# Patient Record
Sex: Male | Born: 1955 | Race: Black or African American | Hispanic: No | Marital: Single | Smoking: Current some day smoker
Health system: Southern US, Community
[De-identification: ages and names within clinical notes are randomized; demographics above are authoritative.]

## PROBLEM LIST (undated history)

## (undated) DIAGNOSIS — I10 Essential (primary) hypertension: Secondary | ICD-10-CM

## (undated) DIAGNOSIS — J45909 Unspecified asthma, uncomplicated: Secondary | ICD-10-CM

## (undated) HISTORY — PX: CARDIAC SURGERY: SHX584

---

## 2019-01-10 ENCOUNTER — Encounter (HOSPITAL_COMMUNITY): Payer: Self-pay | Admitting: Emergency Medicine

## 2019-01-10 ENCOUNTER — Emergency Department (HOSPITAL_COMMUNITY)
Admission: EM | Admit: 2019-01-10 | Discharge: 2019-01-11 | Disposition: A | Discharge status: Home / Self Care | Payer: No Typology Code available for payment source | Attending: Emergency Medicine | Admitting: Emergency Medicine

## 2019-01-10 ENCOUNTER — Other Ambulatory Visit: Payer: Self-pay

## 2019-01-10 ENCOUNTER — Emergency Department (HOSPITAL_COMMUNITY): Payer: No Typology Code available for payment source

## 2019-01-10 DIAGNOSIS — Y929 Unspecified place or not applicable: Secondary | ICD-10-CM | POA: Diagnosis not present

## 2019-01-10 DIAGNOSIS — M25561 Pain in right knee: Secondary | ICD-10-CM | POA: Insufficient documentation

## 2019-01-10 DIAGNOSIS — F17200 Nicotine dependence, unspecified, uncomplicated: Secondary | ICD-10-CM | POA: Insufficient documentation

## 2019-01-10 DIAGNOSIS — Y999 Unspecified external cause status: Secondary | ICD-10-CM | POA: Insufficient documentation

## 2019-01-10 DIAGNOSIS — I10 Essential (primary) hypertension: Secondary | ICD-10-CM | POA: Diagnosis not present

## 2019-01-10 DIAGNOSIS — Y939 Activity, unspecified: Secondary | ICD-10-CM | POA: Insufficient documentation

## 2019-01-10 DIAGNOSIS — R42 Dizziness and giddiness: Secondary | ICD-10-CM | POA: Diagnosis present

## 2019-01-10 DIAGNOSIS — S161XXA Strain of muscle, fascia and tendon at neck level, initial encounter: Secondary | ICD-10-CM | POA: Diagnosis not present

## 2019-01-10 HISTORY — DX: Essential (primary) hypertension: I10

## 2019-01-10 MED ORDER — IBUPROFEN 400 MG PO TABS
400.0000 mg | ORAL_TABLET | Freq: Once | ORAL | Status: AC
  Administered 2019-01-10: 400 mg via ORAL

## 2019-01-10 MED ORDER — IBUPROFEN 800 MG PO TABS
ORAL_TABLET | ORAL | Status: AC
  Filled 2019-01-10: qty 1
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
## 2019-01-11 ENCOUNTER — Emergency Department (HOSPITAL_COMMUNITY): Payer: No Typology Code available for payment source

## 2019-01-11 MED ORDER — CYCLOBENZAPRINE HCL 10 MG PO TABS: 10.0000 mg | ORAL_TABLET | Freq: Three times a day (TID) | ORAL | 0 refills | Status: DC | PRN

## 2019-01-11 NOTE — Discharge Instructions (Addendum)
Apply ice for 20-30 minutes at a time, 3-4 times a day.  Take acetaminophen and/or ibuprofen as needed for pain.  If you take the 2 medications together, they will give you better pain relief than either one by itself.
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
## 2021-09-20 ENCOUNTER — Inpatient Hospital Stay (HOSPITAL_COMMUNITY)
Admission: EM | Admit: 2021-09-20 | Discharge: 2021-09-22 | DRG: 871 | Disposition: A | Discharge status: Home / Self Care | Payer: Self-pay | Attending: Family Medicine | Admitting: Family Medicine

## 2021-09-20 ENCOUNTER — Emergency Department (HOSPITAL_COMMUNITY): Payer: Self-pay

## 2021-09-20 ENCOUNTER — Encounter (HOSPITAL_COMMUNITY): Payer: Self-pay

## 2021-09-20 ENCOUNTER — Other Ambulatory Visit: Payer: Self-pay

## 2021-09-20 DIAGNOSIS — N183 Chronic kidney disease, stage 3 unspecified: Secondary | ICD-10-CM | POA: Diagnosis present

## 2021-09-20 DIAGNOSIS — Z720 Tobacco use: Secondary | ICD-10-CM

## 2021-09-20 DIAGNOSIS — N1831 Chronic kidney disease, stage 3a: Secondary | ICD-10-CM

## 2021-09-20 DIAGNOSIS — Z716 Tobacco abuse counseling: Secondary | ICD-10-CM

## 2021-09-20 DIAGNOSIS — I509 Heart failure, unspecified: Secondary | ICD-10-CM

## 2021-09-20 DIAGNOSIS — I5043 Acute on chronic combined systolic (congestive) and diastolic (congestive) heart failure: Secondary | ICD-10-CM | POA: Diagnosis present

## 2021-09-20 DIAGNOSIS — Z28310 Unvaccinated for covid-19: Secondary | ICD-10-CM

## 2021-09-20 DIAGNOSIS — J9601 Acute respiratory failure with hypoxia: Secondary | ICD-10-CM

## 2021-09-20 DIAGNOSIS — J9600 Acute respiratory failure, unspecified whether with hypoxia or hypercapnia: Secondary | ICD-10-CM

## 2021-09-20 DIAGNOSIS — Z20822 Contact with and (suspected) exposure to covid-19: Secondary | ICD-10-CM | POA: Diagnosis present

## 2021-09-20 DIAGNOSIS — E1165 Type 2 diabetes mellitus with hyperglycemia: Secondary | ICD-10-CM | POA: Diagnosis present

## 2021-09-20 DIAGNOSIS — J440 Chronic obstructive pulmonary disease with acute lower respiratory infection: Secondary | ICD-10-CM | POA: Diagnosis present

## 2021-09-20 DIAGNOSIS — Z23 Encounter for immunization: Secondary | ICD-10-CM

## 2021-09-20 DIAGNOSIS — E1122 Type 2 diabetes mellitus with diabetic chronic kidney disease: Secondary | ICD-10-CM | POA: Diagnosis present

## 2021-09-20 DIAGNOSIS — E119 Type 2 diabetes mellitus without complications: Secondary | ICD-10-CM

## 2021-09-20 DIAGNOSIS — I5020 Unspecified systolic (congestive) heart failure: Secondary | ICD-10-CM | POA: Diagnosis present

## 2021-09-20 DIAGNOSIS — N179 Acute kidney failure, unspecified: Secondary | ICD-10-CM | POA: Diagnosis present

## 2021-09-20 DIAGNOSIS — J441 Chronic obstructive pulmonary disease with (acute) exacerbation: Secondary | ICD-10-CM

## 2021-09-20 DIAGNOSIS — I130 Hypertensive heart and chronic kidney disease with heart failure and stage 1 through stage 4 chronic kidney disease, or unspecified chronic kidney disease: Secondary | ICD-10-CM | POA: Diagnosis present

## 2021-09-20 DIAGNOSIS — R0600 Dyspnea, unspecified: Secondary | ICD-10-CM

## 2021-09-20 DIAGNOSIS — F17210 Nicotine dependence, cigarettes, uncomplicated: Secondary | ICD-10-CM | POA: Diagnosis present

## 2021-09-20 DIAGNOSIS — A419 Sepsis, unspecified organism: Principal | ICD-10-CM | POA: Diagnosis present

## 2021-09-20 DIAGNOSIS — I5189 Other ill-defined heart diseases: Secondary | ICD-10-CM

## 2021-09-20 DIAGNOSIS — K219 Gastro-esophageal reflux disease without esophagitis: Secondary | ICD-10-CM | POA: Diagnosis present

## 2021-09-20 DIAGNOSIS — R739 Hyperglycemia, unspecified: Secondary | ICD-10-CM

## 2021-09-20 DIAGNOSIS — Z79899 Other long term (current) drug therapy: Secondary | ICD-10-CM

## 2021-09-20 DIAGNOSIS — J189 Pneumonia, unspecified organism: Secondary | ICD-10-CM | POA: Diagnosis present

## 2021-09-20 HISTORY — DX: Unspecified asthma, uncomplicated: J45.909

## 2021-09-20 LAB — MRSA NEXT GEN BY PCR, NASAL: MRSA by PCR Next Gen: NOT DETECTED

## 2021-09-20 LAB — TROPONIN I (HIGH SENSITIVITY)
Troponin I (High Sensitivity): 75 ng/L — ABNORMAL HIGH (ref <18)
Troponin I (High Sensitivity): 64 ng/L — ABNORMAL HIGH (ref <18)

## 2021-09-20 LAB — CBC WITH DIFFERENTIAL/PLATELET
Abs Immature Granulocytes: 0.05 10*3/uL (ref 0.00–0.07)
Basophils Absolute: 0.1 10*3/uL (ref 0.0–0.1)
Basophils Relative: 1 %
Eosinophils Absolute: 0.6 10*3/uL — ABNORMAL HIGH (ref 0.0–0.5)
Eosinophils Relative: 7 %
HCT: 52.4 % — ABNORMAL HIGH (ref 39.0–52.0)
Hemoglobin: 16.9 g/dL (ref 13.0–17.0)
Immature Granulocytes: 1 %
Lymphocytes Relative: 19 %
Lymphs Abs: 1.5 10*3/uL (ref 0.7–4.0)
MCH: 31 pg (ref 26.0–34.0)
MCHC: 32.3 g/dL (ref 30.0–36.0)
MCV: 96.1 fL (ref 80.0–100.0)
Monocytes Absolute: 1.1 10*3/uL — ABNORMAL HIGH (ref 0.1–1.0)
Monocytes Relative: 13 %
Neutro Abs: 4.8 10*3/uL (ref 1.7–7.7)
Neutrophils Relative %: 59 %
Platelets: 124 10*3/uL — ABNORMAL LOW (ref 150–400)
RBC: 5.45 MIL/uL (ref 4.22–5.81)
RDW: 13.5 % (ref 11.5–15.5)
WBC: 8.2 10*3/uL (ref 4.0–10.5)
nRBC: 0 % (ref 0.0–0.2)

## 2021-09-20 LAB — COMPREHENSIVE METABOLIC PANEL
ALT: 64 U/L — ABNORMAL HIGH (ref 0–44)
AST: 37 U/L (ref 15–41)
Albumin: 4.7 g/dL (ref 3.5–5.0)
Alkaline Phosphatase: 51 U/L (ref 38–126)
Anion gap: 12 (ref 5–15)
BUN: 13 mg/dL (ref 8–23)
CO2: 21 mmol/L — ABNORMAL LOW (ref 22–32)
Calcium: 9.4 mg/dL (ref 8.9–10.3)
Chloride: 106 mmol/L (ref 98–111)
Creatinine, Ser: 1.75 mg/dL — ABNORMAL HIGH (ref 0.61–1.24)
GFR, Estimated: 43 mL/min — ABNORMAL LOW (ref >60)
Glucose, Bld: 226 mg/dL — ABNORMAL HIGH (ref 70–99)
Potassium: 4.3 mmol/L (ref 3.5–5.1)
Sodium: 139 mmol/L (ref 135–145)
Total Bilirubin: 1.4 mg/dL — ABNORMAL HIGH (ref 0.3–1.2)
Total Protein: 8.2 g/dL — ABNORMAL HIGH (ref 6.5–8.1)

## 2021-09-20 LAB — HEMOGLOBIN A1C
Hgb A1c MFr Bld: 6.4 % — ABNORMAL HIGH (ref 4.8–5.6)
Mean Plasma Glucose: 136.98 mg/dL

## 2021-09-20 LAB — RESP PANEL BY RT-PCR (FLU A&B, COVID) ARPGX2
Influenza A by PCR: NEGATIVE
Influenza B by PCR: NEGATIVE
SARS Coronavirus 2 by RT PCR: NEGATIVE

## 2021-09-20 LAB — BLOOD GAS, ARTERIAL
Acid-base deficit: 3.5 mmol/L — ABNORMAL HIGH (ref 0.0–2.0)
Bicarbonate: 21.6 mmol/L (ref 20.0–28.0)
FIO2: 40
O2 Saturation: 98.4 %
Patient temperature: 39.5
pCO2 arterial: 44.1 mmHg (ref 32.0–48.0)
pH, Arterial: 7.318 — ABNORMAL LOW (ref 7.350–7.450)
pO2, Arterial: 176 mmHg — ABNORMAL HIGH (ref 83.0–108.0)

## 2021-09-20 LAB — TSH: TSH: 0.539 u[IU]/mL (ref 0.350–4.500)

## 2021-09-20 LAB — BRAIN NATRIURETIC PEPTIDE: B Natriuretic Peptide: 50 pg/mL (ref 0.0–100.0)

## 2021-09-20 LAB — GLUCOSE, CAPILLARY: Glucose-Capillary: 141 mg/dL — ABNORMAL HIGH (ref 70–99)

## 2021-09-20 LAB — LACTIC ACID, PLASMA: Lactic Acid, Venous: 0.5 mmol/L (ref 0.5–1.9)

## 2021-09-20 LAB — PHOSPHORUS: Phosphorus: 2.9 mg/dL (ref 2.5–4.6)

## 2021-09-20 LAB — MAGNESIUM: Magnesium: 2.3 mg/dL (ref 1.7–2.4)

## 2021-09-20 LAB — CBG MONITORING, ED: Glucose-Capillary: 187 mg/dL — ABNORMAL HIGH (ref 70–99)

## 2021-09-20 MED ORDER — SODIUM CHLORIDE 0.9 % IV SOLN
1.0000 g | INTRAVENOUS | Status: DC
  Administered 2021-09-21 – 2021-09-22 (×2): 1 g via INTRAVENOUS
  Filled 2021-09-20 (×3): qty 10

## 2021-09-20 MED ORDER — ACETAMINOPHEN 650 MG RE SUPP
650.0000 mg | Freq: Once | RECTAL | Status: AC
  Administered 2021-09-20: 650 mg via RECTAL
  Filled 2021-09-20: qty 1

## 2021-09-20 MED ORDER — ALBUTEROL (5 MG/ML) CONTINUOUS INHALATION SOLN
10.0000 mg/h | INHALATION_SOLUTION | Freq: Once | RESPIRATORY_TRACT | Status: DC
Start: 2021-09-20 — End: 2021-09-22
  Filled 2021-09-20: qty 20

## 2021-09-20 MED ORDER — ACETAMINOPHEN 325 MG PO TABS
650.0000 mg | ORAL_TABLET | Freq: Four times a day (QID) | ORAL | Status: DC | PRN
  Administered 2021-09-20: 650 mg via ORAL
  Filled 2021-09-20: qty 2

## 2021-09-20 MED ORDER — ALBUTEROL SULFATE (2.5 MG/3ML) 0.083% IN NEBU: 2.5000 mg | INHALATION_SOLUTION | RESPIRATORY_TRACT | Status: DC | PRN

## 2021-09-20 MED ORDER — SODIUM CHLORIDE 0.9 % IV SOLN
500.0000 mg | Freq: Once | INTRAVENOUS | Status: AC
  Administered 2021-09-20: 500 mg via INTRAVENOUS
  Filled 2021-09-20: qty 500

## 2021-09-20 MED ORDER — HYDRALAZINE HCL 20 MG/ML IJ SOLN: 10.0000 mg | Freq: Three times a day (TID) | INTRAMUSCULAR | Status: DC | PRN

## 2021-09-20 MED ORDER — INSULIN ASPART 100 UNIT/ML IJ SOLN
0.0000 [IU] | Freq: Three times a day (TID) | INTRAMUSCULAR | Status: DC
  Administered 2021-09-21 (×3): 2 [IU] via SUBCUTANEOUS
  Administered 2021-09-22: 3 [IU] via SUBCUTANEOUS

## 2021-09-20 MED ORDER — ENOXAPARIN SODIUM 40 MG/0.4ML IJ SOSY
40.0000 mg | PREFILLED_SYRINGE | INTRAMUSCULAR | Status: DC
  Administered 2021-09-20 – 2021-09-21 (×2): 40 mg via SUBCUTANEOUS
  Filled 2021-09-20 (×2): qty 0.4

## 2021-09-20 MED ORDER — SODIUM CHLORIDE 0.9 % IV SOLN
1.0000 g | Freq: Once | INTRAVENOUS | Status: AC
  Administered 2021-09-20: 1 g via INTRAVENOUS
  Filled 2021-09-20: qty 10

## 2021-09-20 MED ORDER — METHYLPREDNISOLONE SODIUM SUCC 40 MG IJ SOLR
40.0000 mg | Freq: Two times a day (BID) | INTRAMUSCULAR | Status: DC
  Administered 2021-09-20 – 2021-09-22 (×4): 40 mg via INTRAVENOUS
  Filled 2021-09-20 (×4): qty 1

## 2021-09-20 MED ORDER — ALBUTEROL SULFATE (2.5 MG/3ML) 0.083% IN NEBU
INHALATION_SOLUTION | RESPIRATORY_TRACT | Status: AC
  Administered 2021-09-20: 10 mg
  Filled 2021-09-20: qty 12

## 2021-09-20 MED ORDER — PANTOPRAZOLE SODIUM 40 MG PO TBEC
40.0000 mg | DELAYED_RELEASE_TABLET | Freq: Every day | ORAL | Status: DC
  Administered 2021-09-20 – 2021-09-22 (×3): 40 mg via ORAL
  Filled 2021-09-20 (×3): qty 1

## 2021-09-20 MED ORDER — ONDANSETRON HCL 4 MG/2ML IJ SOLN: 4.0000 mg | Freq: Four times a day (QID) | INTRAMUSCULAR | Status: DC | PRN

## 2021-09-20 MED ORDER — FUROSEMIDE 10 MG/ML IJ SOLN
40.0000 mg | Freq: Every day | INTRAMUSCULAR | Status: DC
  Administered 2021-09-20 – 2021-09-21 (×2): 40 mg via INTRAVENOUS
  Filled 2021-09-20 (×2): qty 4

## 2021-09-20 MED ORDER — BUDESONIDE 0.5 MG/2ML IN SUSP
0.5000 mg | Freq: Two times a day (BID) | RESPIRATORY_TRACT | Status: DC
  Administered 2021-09-20 – 2021-09-22 (×4): 0.5 mg via RESPIRATORY_TRACT
  Filled 2021-09-20 (×4): qty 2

## 2021-09-20 MED ORDER — INSULIN ASPART 100 UNIT/ML IJ SOLN: 0.0000 [IU] | Freq: Every day | INTRAMUSCULAR | Status: DC

## 2021-09-20 MED ORDER — INSULIN DETEMIR 100 UNIT/ML ~~LOC~~ SOLN
7.0000 [IU] | Freq: Every day | SUBCUTANEOUS | Status: DC
  Administered 2021-09-20 – 2021-09-21 (×2): 7 [IU] via SUBCUTANEOUS
  Filled 2021-09-20 (×3): qty 0.07

## 2021-09-20 MED ORDER — ONDANSETRON HCL 4 MG PO TABS: 4.0000 mg | ORAL_TABLET | Freq: Four times a day (QID) | ORAL | Status: DC | PRN

## 2021-09-20 MED ORDER — DEXTROSE 5 % IV SOLN
250.0000 mg | INTRAVENOUS | Status: DC
  Filled 2021-09-20: qty 250

## 2021-09-20 MED ORDER — NICOTINE 21 MG/24HR TD PT24
21.0000 mg | MEDICATED_PATCH | Freq: Every day | TRANSDERMAL | Status: DC
  Administered 2021-09-20 – 2021-09-22 (×3): 21 mg via TRANSDERMAL
  Filled 2021-09-20 (×3): qty 1

## 2021-09-20 MED ORDER — IPRATROPIUM-ALBUTEROL 0.5-2.5 (3) MG/3ML IN SOLN
3.0000 mL | Freq: Four times a day (QID) | RESPIRATORY_TRACT | Status: DC
  Administered 2021-09-20 – 2021-09-22 (×6): 3 mL via RESPIRATORY_TRACT
  Filled 2021-09-20 (×6): qty 3

## 2021-09-20 MED ORDER — INFLUENZA VAC SPLIT QUAD 0.5 ML IM SUSY
0.5000 mL | PREFILLED_SYRINGE | INTRAMUSCULAR | Status: AC
  Administered 2021-09-22: 0.5 mL via INTRAMUSCULAR
  Filled 2021-09-20: qty 0.5

## 2021-09-20 MED ORDER — DM-GUAIFENESIN ER 30-600 MG PO TB12
1.0000 | ORAL_TABLET | Freq: Two times a day (BID) | ORAL | Status: DC
  Administered 2021-09-20 – 2021-09-22 (×4): 1 via ORAL
  Filled 2021-09-20 (×4): qty 1

## 2021-09-20 MED ORDER — PNEUMOCOCCAL VAC POLYVALENT 25 MCG/0.5ML IJ INJ
0.5000 mL | INJECTION | INTRAMUSCULAR | Status: AC
  Administered 2021-09-22: 0.5 mL via INTRAMUSCULAR
  Filled 2021-09-20: qty 0.5

## 2021-09-20 NOTE — ED Triage Notes (Signed)
Noncompliant with meds at home. Has not taken any or been to PCP in years

## 2021-09-20 NOTE — ED Notes (Signed)
Lab at bedside. Will cycle BP once cultures are obtained.

## 2021-09-20 NOTE — ED Notes (Signed)
Emergency Contact: Margaretha Seeds [phone]

## 2021-09-20 NOTE — ED Triage Notes (Signed)
EMS from home. Cc of SOB.  Nebs albuterol x2  ci pap 125 solumedrol  20r ac Diaphoretic. Tripod.  85% initial. 96% on ci pap.

## 2021-09-20 NOTE — H&P (Signed)
History and Physical    Aaron Donaldson CWC:[id number] DOB: 04-12-1956 DOA: 09/20/2021  PCP: Patient, No Pcp Per (Inactive)   Patient coming from: Home  I have personally briefly reviewed patient's old medical records in [place]  Chief Complaint: Shortness of breath, dyspnea on exertion and coughing spells.  HPI: Aaron Donaldson is a 65 y.o. male with medical history significant of hypertension, chronic kidney disease a stage IIIa, asthma/COPD and tobacco abuse; who presented to the emergency department secondary to worsening shortness of breath, dyspnea exertion productive cough.  Patient reports symptoms have been present for the last 2 days or so and worsening.  Yesterday night he has to call EMS secondary to worsening respiratory status he was assessed nebulized improved and initially not transported for further evaluation.  Couple hours later symptoms worsen more and EMS was recalled; at that time his steroids IV were provided and due to significant hypoxia appreciated patient was started on CPAP and transported to the emergency department for further evaluation and management.  Patient reports associated productive coughing spells and general malaise. Patient reports no nausea, no vomiting, no hemoptysis, no dysuria, no hematuria, no focal weaknesses, no sick contact, overt bleeding or any other complaints.  Not vaccinated against COVID.  COVID PCR in the ED negative.  ED Course: Positive tachypnea and high temperature; chest x-ray demonstrating left lower lobe infiltrate suggestive of pneumonia and also vascular congestion.  Mild elevation in his troponin without acute ischemic changes on EKG.  Normal WBCs, normal BPN, elevated blood sugar and a creatinine of 1.75.  Patient placed on BiPAP, steroids and nebulizer management provided.  Empiric antibiotics for pneumonia initiated and TRH call to place in the hospital for further evaluation and management.  Review of Systems: As  per HPI otherwise all other systems reviewed and are negative.   Past Medical History:  Diagnosis Date   Asthma    Hypertension     Past Surgical History:  Procedure Laterality Date   CARDIAC SURGERY      Social History  reports that he has been smoking. He has never used smokeless tobacco. He reports that he does not drink alcohol and does not use drugs.  No Known Allergies  Family history:  -History reviewed and outside high blood pressure No pertinent family history.   Prior to Admission medications   Medication Sig Start Date End Date Taking? Authorizing Provider  cyclobenzaprine (FLEXERIL) 10 MG tablet Take 1 tablet (10 mg total) by mouth 3 (three) times daily as needed for muscle spasms. Patient not taking: Reported on 76/11/6071 05[birth year]   Delora Fuel, MD    Physical Exam: Vitals:   09/20/21 1300 09/20/21 1500 09/20/21 1600 09/20/21 1700  BP: (!) 160/91     Pulse: 70 63 (!) 56 70  Resp: (!) 23 (!) 21 (!) 21 (!) 27  Temp: 99 F (37.2 C) 98 F (36.7 C)    TempSrc: Oral Oral    SpO2: 98% 98% 98% 100%  Weight: 81.9 kg     Height: _0  (1.88 m)       Constitutional: Mild distress secondary to shortness of breath and inability to speak in full sentences.  During initial evaluation patient was requiring to be on BiPAP.  No chest pain, no nausea, no vomiting. Vitals:   09/20/21 1300 09/20/21 1500 09/20/21 1600 09/20/21 1700  BP: (!) 160/91     Pulse: 70 63 (!) 56 70  Resp: (!) 23 (!) 21 (!) 21 (!)  27  Temp: 99 F (37.2 C) 98 F (36.7 C)    TempSrc: Oral Oral    SpO2: 98% 98% 98% 100%  Weight: 81.9 kg     Height: _0  (1.88 m)      Eyes: PERRL, lids and conjunctivae normal, no icterus, no nystagmus. ENMT: Mucous membranes are moist. Posterior pharynx clear of any exudate or lesions. Neck: normal, supple, no masses, no thyromegaly, no JVD on exam. Respiratory: Positive rhonchi bilaterally, decreased breath sounds at the bases, expiratory wheezing  appreciated.  Positive tachypnea seen on examination. Cardiovascular: Regular rate and rhythm, no murmurs / rubs / gallops. No extremity edema.  Abdomen: no tenderness, no masses palpated. No hepatosplenomegaly. Bowel sounds positive.  Musculoskeletal: no clubbing / cyanosis. No joint deformity upper and lower extremities. Good ROM, no contractures. Normal muscle tone.  Skin: no rashes, lesions, ulcers. No induration Neurologic: CN 2-12 grossly intact. Sensation intact, DTR normal. Strength 5/5 in all 4.  Psychiatric: Normal judgment and insight. Alert and oriented x 3. Normal mood.   Labs on Admission: I have personally reviewed following labs and imaging studies  CBC: Recent Labs  Lab 09/20/21 0703  WBC 8.2  NEUTROABS 4.8  HGB 16.9  HCT 52.4*  MCV 96.1  PLT 124*    Basic Metabolic Panel: Recent Labs  Lab 09/20/21 0703  NA 139  K 4.3  CL 106  CO2 21*  GLUCOSE 226*  BUN 13  CREATININE 1.75*  CALCIUM 9.4    GFR: Estimated Creatinine Clearance: 49.4 mL/min (A) (by C-G formula based on SCr of 1.75 mg/dL (H)).  Liver Function Tests: Recent Labs  Lab 09/20/21 0703  AST 37  ALT 64*  ALKPHOS 51  BILITOT 1.4*  PROT 8.2*  ALBUMIN 4.7    Radiological Exams on Admission: DG Chest Port 1 View  Result Date: 09/20/2021 CLINICAL DATA:  Shortness of breath EXAM: PORTABLE CHEST 1 VIEW COMPARISON:  None. FINDINGS: Heart is enlarged. Mediastinum appears stable. Calcified plaques in the aortic arch. Pulmonary vascular congestion and prominent interstitial lung markings. Likely small left pleural effusion with associated atelectasis/infiltrate. No pneumothorax. IMPRESSION: 1. Cardiomegaly and pulmonary vascular congestion. 2. Likely small left pleural effusion with associated atelectasis/scarring/infiltrate. 3. COPD. Electronically Signed   By: Ofilia Neas M.D.   On: 09/20/2021 07:35    EKG: Independently reviewed.  Sinus rhythm, no acute ischemic  changes.  Assessment/Plan 1-acute respiratory failure with hypoxia -Per EMS report patient hypoxic in the mid 80s with excessive tachypnea and requirement of CPAP prior to transfer to ED. -Saturation appreciated to be in the mid 90s while on BiPAP. -Patient respiratory failure appears to be a combination of COPD exacerbation along with pneumonia; given findings of pleural effusion and vascular congestion he could potentially also have an adjuvant presentation of CHF. (Patient reported no prior history of heart failure). -We will check 2D echo, low-dose Lasix will be provided and will follow daily weights and strict I's and O's. -Patient started on antibiotics for community-acquired pneumonia -Influenza and COVID PCR negative -Incentive spirometer, flutter valve, nebulizer management (using DuoNeb and Pulmicort) along with IV steroids will be provided. -Wean off BiPAP and oxygen supplementation as tolerated.  2-sepsis -Patient met sepsis criteria on presentation with elevated temperature, elevated respiratory rate and findings of For left lower lobe pneumonia. -As mentioned above antibiotics will be provided -Normal lactic acid -Normal WBCs currently. -Will provide as needed antipyretics -Provide oxygen supplementation and follow clinical response.  3-hyperglycemia -No prior history of diabetes -We  will check A1c -Sliding scale insulin and Levemir will be ordered.  4-chronic kidney disease a stage IIIa -Minimize nephrotoxic agents -Maintain adequate hydration -Avoid hypotension and contrast -Follow renal function trend.  5-Tobacco abuse -Cessation counseling provided -Nicotine patch has been ordered.  6-GI prophylaxis -PPI has been ordered.  7-hypertension -No prior antihypertensive agents has been seen on his medication records -Blood pressure fairly stable at this time -Will use as needed hydralazine.    DVT prophylaxis: Lovenox Code Status:   Full code. Family  Communication:  No family at bedside. Disposition Plan:   Patient is from:  Home  Anticipated DC to:  Home  Anticipated DC date:  To be determined; very likely 11/3--11/4  Anticipated DC barriers: A stabilization respiratory status/COPD exacerbation and resolution of sepsis.  Completion work-up for potential component of heart failure.  Consults called:  None  Admission status:  Inpatient, stepdown, length of stay more than 2 midnights.  Severity of Illness: The appropriate patient status for this patient is INPATIENT. Inpatient status is judged to be reasonable and necessary in order to provide the required intensity of service to ensure the patient's safety. The patient's presenting symptoms, physical exam findings, and initial radiographic and laboratory data in the context of their chronic comorbidities is felt to place them at high risk for further clinical deterioration. Furthermore, it is not anticipated that the patient will be medically stable for discharge from the hospital within 2 midnights of admission.   * I certify that at the point of admission it is my clinical judgment that the patient will require inpatient hospital care spanning beyond 2 midnights from the point of admission due to high intensity of service, high risk for further deterioration and high frequency of surveillance required.Barton Dubois MD [facility]  How to contact the [facility] Attending or Consulting provider [place] or covering provider during after hours [place], for this patient?   Check the care team in [facility] and look for a) attending/consulting [facility] provider listed and b) the [facility] team listed Log into [email or web address] and use [place]'s universal password to access. If you do not have the password, please contact the hospital operator. Locate the [facility] provider you are looking for under [facility] and page to a number that you can be directly reached. If you still have difficulty reaching the  provider, please page the [facility] (Director on Call) for the Hospitalists listed on amion for assistance.  09/20/2021, 5:38 PM

## 2021-09-20 NOTE — ED Provider Notes (Addendum)
[facility] EMERGENCY DEPARTMENT Provider Note   CSN: [id number] Arrival date & time: 09/20/21  [id number]     History Chief Complaint  Patient presents with   Shortness of Breath    Aaron Donaldson is a 65 y.o. male.  Patient brought in by [facility] in respiratory distress.  Patient has a history of COPD.  Not seen in our institution in the past.  Unknown to patient he was febrile temp of 103.  He came in on CPAP.  Was received Solu-Medrol.  EMS was out to his house shortly after midnight for shortness of breath he got a nebulizer treatment and felt better.  So he was not brought in at that time.  He recalled EMS in significant distress diaphoretic severely tachypneic.  Not clear at this time whether patient is normally on any oxygen.      Past Medical History:  Diagnosis Date   Asthma    Hypertension     There are no problems to display for this patient.   Past Surgical History:  Procedure Laterality Date   CARDIAC SURGERY         No family history on file.  Social History   Tobacco Use   Smoking status: Some Days   Smokeless tobacco: Never  Substance Use Topics   Alcohol use: Never   Drug use: Never    Home Medications Prior to Admission medications   Medication Sig Start Date End Date Taking? Authorizing Provider  cyclobenzaprine (FLEXERIL) 10 MG tablet Take 1 tablet (10 mg total) by mouth 3 (three) times daily as needed for muscle spasms. [id number]   Delora Fuel, MD    Allergies    Patient has no known allergies.  Review of Systems   Review of Systems  Constitutional:  Positive for fever. Negative for chills.  HENT:  Negative for ear pain and sore throat.   Eyes:  Negative for pain and visual disturbance.  Respiratory:  Positive for shortness of breath. Negative for cough.   Cardiovascular:  Negative for chest pain, palpitations and leg swelling.  Gastrointestinal:  Negative for abdominal pain and vomiting.  Genitourinary:  Negative for  dysuria and hematuria.  Musculoskeletal:  Negative for arthralgias and back pain.  Skin:  Negative for color change and rash.  Neurological:  Negative for seizures and syncope.  All other systems reviewed and are negative.  Physical Exam Updated Vital Signs BP (!) 177/122   Pulse 81   Temp (!) 103.1 F (39.5 C) (Rectal)   Resp (!) 24   Ht 1.88 m (6\' 2" )   Wt 82 kg   SpO2 100%   BMI 23.21 kg/m   Physical Exam Vitals and nursing note reviewed.  Constitutional:      General: He is in acute distress.     Appearance: He is well-developed. He is diaphoretic.  HENT:     Head: Normocephalic and atraumatic.  Eyes:     Extraocular Movements: Extraocular movements intact.     Conjunctiva/sclera: Conjunctivae normal.     Pupils: Pupils are equal, round, and reactive to light.  Cardiovascular:     Rate and Rhythm: Regular rhythm. Tachycardia present.     Heart sounds: No murmur heard. Pulmonary:     Effort: Respiratory distress present.     Breath sounds: Wheezing present.  Abdominal:     Palpations: Abdomen is soft.     Tenderness: There is no abdominal tenderness.  Musculoskeletal:        General:  No swelling.     Cervical back: Normal range of motion and neck supple.     Right lower leg: No edema.     Left lower leg: No edema.  Skin:    General: Skin is warm.     Capillary Refill: Capillary refill takes less than 2 seconds.  Neurological:     General: No focal deficit present.     Mental Status: He is alert and oriented to person, place, and time.    ED Results / Procedures / Treatments   Labs (all labs ordered are listed, but only abnormal results are displayed) Labs Reviewed  CBG MONITORING, ED - Abnormal; Notable for the following components:      Result Value   Glucose-Capillary 187 (*)    All other components within normal limits  RESP PANEL BY RT-PCR (FLU A&B, COVID) ARPGX2  CULTURE, BLOOD (ROUTINE X 2)  CULTURE, BLOOD (ROUTINE X 2)  LACTIC ACID, PLASMA   CBC WITH DIFFERENTIAL/PLATELET  COMPREHENSIVE METABOLIC PANEL  BRAIN NATRIURETIC PEPTIDE  BLOOD GAS, ARTERIAL  TROPONIN I (HIGH SENSITIVITY)    EKG EKG Interpretation  Date/Time:  Tuesday September 20 2021 07:04:22 EDT Ventricular Rate:  120 PR Interval:  169 QRS Duration: 114 QT Interval:  294 QTC Calculation: 416 R Axis:   209 Text Interpretation: Sinus tachycardia Multiple ventricular premature complexes LAE, consider biatrial enlargement IRBBB and LPFB Abnormal R-wave progression, late transition Lateral infarct, age indeterminate Artifact in lead(s) I II III aVR aVL aVF V1 V3 V4 V6 No previous ECGs available Interpretation limited secondary to artifact Confirmed by Fredia Sorrow [id number]) on 09/20/2021 7:35:41 AM  Radiology DG Chest Port 1 View  Result Date: 09/20/2021 CLINICAL DATA:  Shortness of breath EXAM: PORTABLE CHEST 1 VIEW COMPARISON:  None. FINDINGS: Heart is enlarged. Mediastinum appears stable. Calcified plaques in the aortic arch. Pulmonary vascular congestion and prominent interstitial lung markings. Likely small left pleural effusion with associated atelectasis/infiltrate. No pneumothorax. IMPRESSION: 1. Cardiomegaly and pulmonary vascular congestion. 2. Likely small left pleural effusion with associated atelectasis/scarring/infiltrate. 3. COPD. Electronically Signed   By: Ofilia Neas M.D.   On: 09/20/2021 07:35    Procedures Procedures   Medications Ordered in ED Medications  albuterol (PROVENTIL,VENTOLIN) solution continuous neb (10 mg/hr Nebulization Not Given 09/20/21 [id number])  acetaminophen (TYLENOL) suppository 650 mg (has no administration in time range)  cefTRIAXone (ROCEPHIN) 1 g in sodium chloride 0.9 % 100 mL IVPB (has no administration in time range)  azithromycin (ZITHROMAX) 500 mg in sodium chloride 0.9 % 250 mL IVPB (has no administration in time range)  albuterol (PROVENTIL) (2.5 MG/3ML) 0.083% nebulizer solution (10 mg  Given 09/20/21 [id number])     ED Course  I have reviewed the triage vital signs and the nursing notes.  Pertinent labs & imaging results that were available during my care of the patient were reviewed by me and considered in my medical decision making (see chart for details).    MDM Rules/Calculators/A&P                         CRITICAL CARE Performed by: Fredia Sorrow Total critical care time: 45 minutes Critical care time was exclusive of separately billable procedures and treating other patients. Critical care was necessary to treat or prevent imminent or life-threatening deterioration. Critical care was time spent personally by me on the following activities: development of treatment plan with patient and/or surrogate as well as nursing, discussions with consultants, evaluation of patient's  response to treatment, examination of patient, obtaining history from patient or surrogate, ordering and performing treatments and interventions, ordering and review of laboratory studies, ordering and review of radiographic studies, pulse oximetry and re-evaluation of patient's condition.   Patient brought in by [facility] EMS in obvious respiratory distress.  Was on CPAP.  Switched to BiPAP here.  Patient received Solu-Medrol by EMS.  And that we did a continuous nebulizer here.  Patient now settling down.  Patient was diaphoretic significantly tachypneic using accessory muscles to breathe.  Now improving be able to talk better.  Not sweaty.  Still very hypertensive.  Chest x-ray consistent with COPD also possible pneumonia.  Patient does have a temp of 103.  This could be due to a viral pneumonia influenza COVID testing pending.  The patient started on pneumonia antibiotics.  Lactic acid ordered blood cultures done.  Patient did meet sepsis criteria and that he was tachycardic and febrile and tachypneic but not hypotensive at all.  Held off on giving large amount of fluids while were sorting out whether there may be some  evidence of some pulmonary edema which is possible on the chest x-ray.  We will hold off on fluid boluses at this time.  Patient given Tylenol for the fever.  Patient clearly will need admission.  As we gather more information.  Patient's blood pressure shows signs of improvement now that he settled down.  We thought maybe the hypertension was related to the distress.  Systolic now 127.  We will follow this carefully.  Chest x-ray with questionable infiltrate.  Patient started on antibiotics for possible pneumonia.  Zithromax and Rocephin.  Patient's fever may very well be viral COVID and influenza testing still pending.  Patient's initial blood gas showed reasonable PCO2 O2 on the BiPAP.  Patient's improved significantly on the BiPAP feeling much more comfortable.  Initial troponin was elevated at 70 5 repeat was 64.  So that without significant increase.   Patient's symptoms are mostly exacerbation of COPD.  But does have the fever.  Do not feel that patient needs fluid challenge for sepsis lactic acid was not elevated no leukocytosis.  Final Clinical Impression(s) / ED Diagnoses Final diagnoses:  COPD exacerbation (HCC)  Acute respiratory failure, unspecified whether with hypoxia or hypercapnia [facility])    Rx / DC Orders ED Discharge Orders     None        Vanetta Mulders, MD 09/20/21 Theodis Blaze    Vanetta Mulders, MD 09/20/21 1000    Vanetta Mulders, MD 09/20/21 1001

## 2021-09-20 NOTE — Progress Notes (Signed)
**Note De-Identified Willet Schleifer Obfuscation** Patient removed from BIPAP and placed on 2.5L [place]; tolerating well at this time, VS WNL. RRT to continue to monitor.

## 2021-09-21 ENCOUNTER — Inpatient Hospital Stay (HOSPITAL_COMMUNITY): Payer: Self-pay

## 2021-09-21 DIAGNOSIS — R0609 Other forms of dyspnea: Secondary | ICD-10-CM

## 2021-09-21 DIAGNOSIS — A419 Sepsis, unspecified organism: Principal | ICD-10-CM

## 2021-09-21 DIAGNOSIS — E119 Type 2 diabetes mellitus without complications: Secondary | ICD-10-CM

## 2021-09-21 DIAGNOSIS — N183 Chronic kidney disease, stage 3 unspecified: Secondary | ICD-10-CM | POA: Diagnosis present

## 2021-09-21 LAB — ECHOCARDIOGRAM COMPLETE
AR max vel: 2.47 cm2
AV Area VTI: 2.92 cm2
AV Area mean vel: 2.6 cm2
AV Mean grad: 3 mmHg
AV Peak grad: 6.7 mmHg
Ao pk vel: 1.29 m/s
Area-P 1/2: 4.93 cm2
Calc EF: 28.4 %
Height: 74 in
MV VTI: 2.89 cm2
S' Lateral: 5.7 cm
Single Plane A2C EF: 33.3 %
Single Plane A4C EF: 25.3 %
Weight: 2881.85 [oz_av]

## 2021-09-21 LAB — GLUCOSE, CAPILLARY
Glucose-Capillary: 136 mg/dL — ABNORMAL HIGH (ref 70–99)
Glucose-Capillary: 129 mg/dL — ABNORMAL HIGH (ref 70–99)
Glucose-Capillary: 123 mg/dL — ABNORMAL HIGH (ref 70–99)
Glucose-Capillary: 137 mg/dL — ABNORMAL HIGH (ref 70–99)

## 2021-09-21 LAB — CBC
HCT: 46.2 % (ref 39.0–52.0)
Hemoglobin: 15 g/dL (ref 13.0–17.0)
MCH: 30.2 pg (ref 26.0–34.0)
MCHC: 32.5 g/dL (ref 30.0–36.0)
MCV: 93.1 fL (ref 80.0–100.0)
Platelets: 204 K/uL (ref 150–400)
RBC: 4.96 MIL/uL (ref 4.22–5.81)
RDW: 13.5 % (ref 11.5–15.5)
WBC: 9.5 K/uL (ref 4.0–10.5)
nRBC: 0 % (ref 0.0–0.2)

## 2021-09-21 LAB — BASIC METABOLIC PANEL
Anion gap: 8 (ref 5–15)
BUN: 21 mg/dL (ref 8–23)
CO2: 26 mmol/L (ref 22–32)
Calcium: 9.1 mg/dL (ref 8.9–10.3)
Chloride: 105 mmol/L (ref 98–111)
Creatinine, Ser: 1.46 mg/dL — ABNORMAL HIGH (ref 0.61–1.24)
GFR, Estimated: 53 mL/min — ABNORMAL LOW (ref >60)
Glucose, Bld: 138 mg/dL — ABNORMAL HIGH (ref 70–99)
Potassium: 4.4 mmol/L (ref 3.5–5.1)
Sodium: 139 mmol/L (ref 135–145)

## 2021-09-21 LAB — HIV ANTIBODY (ROUTINE TESTING W REFLEX): HIV Screen 4th Generation wRfx: NONREACTIVE

## 2021-09-21 MED ORDER — CHLORHEXIDINE GLUCONATE CLOTH 2 % EX PADS
6.0000 | MEDICATED_PAD | Freq: Every day | CUTANEOUS | Status: DC
  Administered 2021-09-21 – 2021-09-22 (×2): 6 via TOPICAL

## 2021-09-21 MED ORDER — SODIUM CHLORIDE 0.9 % IV SOLN
500.0000 mg | INTRAVENOUS | Status: DC
  Administered 2021-09-21: 500 mg via INTRAVENOUS
  Filled 2021-09-21 (×2): qty 500

## 2021-09-21 MED ORDER — FUROSEMIDE 40 MG PO TABS
40.0000 mg | ORAL_TABLET | Freq: Every day | ORAL | Status: DC
  Administered 2021-09-22: 40 mg via ORAL
  Filled 2021-09-21: qty 1

## 2021-09-21 NOTE — Progress Notes (Signed)
*  PRELIMINARY RESULTS* Echocardiogram 2D Echocardiogram has been performed.  Carolyne Fiscal 09/21/2021, 2:55 PM

## 2021-09-21 NOTE — Progress Notes (Signed)
PROGRESS NOTE     Aaron Donaldson, is a 65 y.o. male, DOB - [birth year], [id number]  Admit date - 09/20/2021   Admitting Physician Vassie Loll, MD  Outpatient Primary MD for the patient is Patient, No Pcp Per (Inactive)  LOS - 1  Chief Complaint  Patient presents with   Shortness of Breath        Brief Narrative:  65 y.o. male with medical history significant of hypertension, chronic kidney disease  stage IIIa, asthma/COPD and tobacco abuse admitted with acute hypoxic respiratory failure on 09/20/2021 in the setting of pneumonia  Assessment & Plan:   Principal Problem:   Sepsis due to Lt Sided CAP Active Problems:   Acute respiratory failure with hypoxia (HCC)   CKD (chronic kidney disease), stage IIIA   Diabetes mellitus type 2, controlled (HCC)   1)Sepsis secondary to left-sided community-acquired pneumonia-- -COVID-negative -Sepsis pathophysiology improving -Continue IV Rocephin and azithromycin, continue bronchodilators and mucolytics  2)Acute COPD exacerbation/tobacco abuse --- secondary to #1 above -Management as above 1, IV Solu-Medrol as ordered, nicotine patch as ordered  3) acute hypoxic respiratory failure----to #1 #2 above, currently requiring 2 L of oxygen via nasal cannula to keep O2 sats above 90% -On room air today O2 sats 86% -Patient was not on O2 PTA  4)AKI----acute kidney injury on CKD stage -3A   -Creatinine is down to 1.4 from 1.75  renally adjust medications, avoid nephrotoxic agents / dehydration  / hypotension  5)DM2-A1c 6.4 reflecting fair diabetic control PTA -Use Novolog/Humalog Sliding scale insulin with Accu-Cheks/Fingersticks as ordered   6)Elevated BP---  may use IV Hydralazine 10 mg  Every 4 hours Prn for systolic blood pressure over 160 mmhg  7)GERD--continue Protonix especially while on steroid   Disposition/Need for in-Hospital Stay- patient unable to be discharged at this time due to -sepsis secondary to pneumonia  requiring IV antibiotics, IV fluids and supplemental oxygen -Possible discharge in a couple of days if hypoxia continues to improve  Status is: Inpatient  Remains inpatient appropriate because: See disposition above  Disposition: The patient is from: Home              Anticipated d/c is to: Home              Anticipated d/c date is: 1 day              Patient currently is not medically stable to d/c. Barriers: Not Clinically Stable-   Code Status :  -  Code Status: Full Code   Family Communication:    NA (patient is alert, awake and coherent)   Consults  :  na  DVT Prophylaxis  :   - SCDs   enoxaparin (LOVENOX) injection 40 mg Start: 09/20/21 1830    Lab Results  Component Value Date   PLT 204 09/21/2021    Inpatient Medications  Scheduled Meds:  albuterol  10 mg/hr Nebulization Once   budesonide (PULMICORT) nebulizer solution  0.5 mg Nebulization BID   Chlorhexidine Gluconate Cloth  6 each Topical Daily   dextromethorphan-guaiFENesin  1 tablet Oral BID   enoxaparin (LOVENOX) injection  40 mg Subcutaneous Q24H   [START ON 09/22/2021] furosemide  40 mg Oral Daily   influenza vac split quadrivalent PF  0.5 mL Intramuscular Tomorrow-1000   insulin aspart  0-15 Units Subcutaneous TID WC   insulin aspart  0-5 Units Subcutaneous QHS   insulin detemir  7 Units Subcutaneous QHS   ipratropium-albuterol  3 mL Nebulization QID  methylPREDNISolone (SOLU-MEDROL) injection  40 mg Intravenous Q12H   nicotine  21 mg Transdermal Daily   pantoprazole  40 mg Oral Daily   pneumococcal 23 valent vaccine  0.5 mL Intramuscular Tomorrow-1000   Continuous Infusions:  azithromycin 500 mg (09/21/21 1020)   cefTRIAXone (ROCEPHIN)  IV 1 g (09/21/21 0920)   PRN Meds:.acetaminophen, albuterol, hydrALAZINE, ondansetron **OR** ondansetron (ZOFRAN) IV   Anti-infectives (From admission, onward)    Start     Dose/Rate Route Frequency Ordered Stop   09/21/21 1000  cefTRIAXone (ROCEPHIN) 1 g in  sodium chloride 0.9 % 100 mL IVPB        1 g 200 mL/hr over 30 Minutes Intravenous Every 24 hours 09/20/21 1746     09/21/21 1000  azithromycin (ZITHROMAX) 250 mg in dextrose 5 % 125 mL IVPB  Status:  Discontinued        250 mg 125 mL/hr over 60 Minutes Intravenous Every 24 hours 09/20/21 1746 09/21/21 0734   09/21/21 1000  azithromycin (ZITHROMAX) 500 mg in sodium chloride 0.9 % 250 mL IVPB        500 mg 250 mL/hr over 60 Minutes Intravenous Every 24 hours 09/21/21 0734 09/25/21 0959   09/20/21 0815  cefTRIAXone (ROCEPHIN) 1 g in sodium chloride 0.9 % 100 mL IVPB        1 g 200 mL/hr over 30 Minutes Intravenous  Once 09/20/21 0805 09/20/21 0956   09/20/21 0815  azithromycin (ZITHROMAX) 500 mg in sodium chloride 0.9 % 250 mL IVPB        500 mg 250 mL/hr over 60 Minutes Intravenous  Once 09/20/21 0805 09/20/21 1106         Subjective: Mariana Kaufman today has no fevers, no emesis,  No chest pain,   -Cough, shortness of breath and hypoxia persist -Desaturates easily   Objective: Vitals:   09/21/21 1119 09/21/21 1205 09/21/21 1305 09/21/21 1526  BP:   (!) 145/95   Pulse:   72   Resp:      Temp: 98.6 F (37 C)  98.4 F (36.9 C)   TempSrc: Oral  Oral   SpO2:  97% 99% 99%  Weight:      Height:        Intake/Output Summary (Last 24 hours) at 09/21/2021 1533 Last data filed at 09/21/2021 1122 Gross per 24 hour  Intake 360 ml  Output 2750 ml  Net -2390 ml   Filed Weights   09/20/21 0703 09/20/21 1300 09/21/21 0415  Weight: 82 kg 81.9 kg 81.7 kg   Physical Exam  Gen:- Awake Alert,  in no apparent distress  HEENT:- [place].AT, No sclera icterus Nose- [place] 2L/min Neck-Supple Neck,No JVD,.  Lungs-missed breath sounds especially on the left, scattered wheezes and rhonchi CV- S1, S2 normal, regular  Abd-  +ve B.Sounds, Abd Soft, No tenderness,    Extremity/Skin:- No  edema, pedal pulses present  Psych-affect is appropriate, oriented x3 Neuro-no new focal deficits, no  tremors  Data Reviewed: I have personally reviewed following labs and imaging studies  CBC: Recent Labs  Lab 09/20/21 0703 09/21/21 0351  WBC 8.2 9.5  NEUTROABS 4.8  --   HGB 16.9 15.0  HCT 52.4* 46.2  MCV 96.1 93.1  PLT 124* 0000000   Basic Metabolic Panel: Recent Labs  Lab 09/20/21 0703 09/20/21 1756 09/21/21 0351  NA 139  --  139  K 4.3  --  4.4  CL 106  --  105  CO2 21*  --  26  GLUCOSE 226*  --  138*  BUN 13  --  21  CREATININE 1.75*  --  1.46*  CALCIUM 9.4  --  9.1  MG  --  2.3  --   PHOS  --  2.9  --    GFR: Estimated Creatinine Clearance: 59.1 mL/min (A) (by C-G formula based on SCr of 1.46 mg/dL (H)). Liver Function Tests: Recent Labs  Lab 09/20/21 0703  AST 37  ALT 64*  ALKPHOS 51  BILITOT 1.4*  PROT 8.2*  ALBUMIN 4.7   No results for input(s): LIPASE, AMYLASE in the last 168 hours. No results for input(s): AMMONIA in the last 168 hours. Coagulation Profile: No results for input(s): INR, PROTIME in the last 168 hours. Cardiac Enzymes: No results for input(s): CKTOTAL, CKMB, CKMBINDEX, TROPONINI in the last 168 hours. BNP (last 3 results) No results for input(s): PROBNP in the last 8760 hours. HbA1C: Recent Labs    09/20/21 1756  HGBA1C 6.4*   CBG: Recent Labs  Lab 09/20/21 0705 09/20/21 2130 09/21/21 0721 09/21/21 1121  GLUCAP 187* 141* 136* 129*   Lipid Profile: No results for input(s): CHOL, HDL, LDLCALC, TRIG, CHOLHDL, LDLDIRECT in the last 72 hours. Thyroid Function Tests: Recent Labs    09/20/21 1756  TSH 0.539   Anemia Panel: No results for input(s): VITAMINB12, FOLATE, FERRITIN, TIBC, IRON, RETICCTPCT in the last 72 hours. Urine analysis: No results found for: COLORURINE, APPEARANCEUR, LABSPEC, [place], GLUCOSEU, HGBUR, BILIRUBINUR, [place], [place], UROBILINOGEN, NITRITE, LEUKOCYTESUR Sepsis Labs: @LABRCNTIP (procalcitonin:4,lacticidven:4)  ) Recent Results (from the past 240 hour(s))  Culture, blood (Routine X 2)  w Reflex to ID Panel     Status: None (Preliminary result)   Collection Time: 09/20/21  8:00 AM   Specimen: BLOOD LEFT HAND  Result Value Ref Range Status   Specimen Description BLOOD LEFT HAND  Final   Special Requests   Final    BOTTLES DRAWN AEROBIC AND ANAEROBIC Blood Culture results may not be optimal due to an inadequate volume of blood received in culture bottles   Culture   Final    NO GROWTH 1 DAY Performed at [facility], [address], [place], [place] [address]    Report Status PENDING  Incomplete  Culture, blood (Routine X 2) w Reflex to ID Panel     Status: None (Preliminary result)   Collection Time: 09/20/21  8:08 AM   Specimen: Right Antecubital; Blood  Result Value Ref Range Status   Specimen Description RIGHT ANTECUBITAL  Final   Special Requests   Final    BOTTLES DRAWN AEROBIC AND ANAEROBIC Blood Culture results may not be optimal due to an inadequate volume of blood received in culture bottles   Culture   Final    NO GROWTH 1 DAY Performed at [facility], [address], [place], [place] [address]    Report Status PENDING  Incomplete  Resp Panel by RT-PCR (Flu A&B, Covid) Nasopharyngeal Swab     Status: None   Collection Time: 09/20/21  9:59 AM   Specimen: Nasopharyngeal Swab; Nasopharyngeal(NP) swabs in vial transport medium  Result Value Ref Range Status   SARS Coronavirus 2 by RT PCR NEGATIVE NEGATIVE Final    Comment: (NOTE) SARS-CoV-2 target nucleic acids are NOT DETECTED.  The SARS-CoV-2 RNA is generally detectable in upper respiratory specimens during the acute phase of infection. The lowest concentration of SARS-CoV-2 viral copies this assay can detect is 138 copies/mL. A negative result does not preclude SARS-Cov-2 infection  and should not be used as the sole basis for treatment or other patient management decisions. A negative result may occur with  improper specimen collection/handling, submission of specimen other than nasopharyngeal swab,  presence of viral mutation(s) within the areas targeted by this assay, and inadequate number of viral copies(<138 copies/mL). A negative result must be combined with clinical observations, patient history, and epidemiological information. The expected result is Negative.  Fact Sheet for Patients:  EntrepreneurPulse.com.au  Fact Sheet for Healthcare Providers:  IncredibleEmployment.be  This test is no t yet approved or cleared by the [place] FDA and  has been authorized for detection and/or diagnosis of SARS-CoV-2 by FDA under an Emergency Use Authorization (EUA). This EUA will remain  in effect (meaning this test can be used) for the duration of the COVID-19 declaration under Section 564(b)(1) of the Act, 21 U.S.C.section 360bbb-3(b)(1), unless the authorization is terminated  or revoked sooner.       Influenza A by PCR NEGATIVE NEGATIVE Final   Influenza B by PCR NEGATIVE NEGATIVE Final    Comment: (NOTE) The Xpert Xpress SARS-CoV-2/FLU/RSV plus assay is intended as an aid in the diagnosis of influenza from Nasopharyngeal swab specimens and should not be used as a sole basis for treatment. Nasal washings and aspirates are unacceptable for Xpert Xpress SARS-CoV-2/FLU/RSV testing.  Fact Sheet for Patients: EntrepreneurPulse.com.au  Fact Sheet for Healthcare Providers: IncredibleEmployment.be  This test is not yet approved or cleared by the [place] FDA and has been authorized for detection and/or diagnosis of SARS-CoV-2 by FDA under an Emergency Use Authorization (EUA). This EUA will remain in effect (meaning this test can be used) for the duration of the COVID-19 declaration under Section 564(b)(1) of the Act, 21 U.S.C. section 360bbb-3(b)(1), unless the authorization is terminated or revoked.  Performed at [facility], [address], [place], [place] [address]   MRSA Next Gen by PCR,  Nasal     Status: None   Collection Time: 09/20/21  1:00 PM   Specimen: Nasal Mucosa; Nasal Swab  Result Value Ref Range Status   MRSA by PCR Next Gen NOT DETECTED NOT DETECTED Final    Comment: (NOTE) The GeneXpert MRSA Assay (FDA approved for NASAL specimens only), is one component of a comprehensive MRSA colonization surveillance program. It is not intended to diagnose MRSA infection nor to guide or monitor treatment for MRSA infections. Test performance is not FDA approved in patients less than 63 years old. Performed at [facility], [address] [place], [place] [address]       Radiology Studies: [facility] Chest [facility] 1 View  Result Date: 09/20/2021 CLINICAL DATA:  Shortness of breath EXAM: PORTABLE CHEST 1 VIEW COMPARISON:  None. FINDINGS: Heart is enlarged. Mediastinum appears stable. Calcified plaques in the aortic arch. Pulmonary vascular congestion and prominent interstitial lung markings. Likely small left pleural effusion with associated atelectasis/infiltrate. No pneumothorax. IMPRESSION: 1. Cardiomegaly and pulmonary vascular congestion. 2. Likely small left pleural effusion with associated atelectasis/scarring/infiltrate. 3. COPD. Electronically Signed   By: Ofilia Neas M.D.   On: 09/20/2021 07:35     Scheduled Meds:  albuterol  10 mg/hr Nebulization Once   budesonide (PULMICORT) nebulizer solution  0.5 mg Nebulization BID   Chlorhexidine Gluconate Cloth  6 each Topical Daily   dextromethorphan-guaiFENesin  1 tablet Oral BID   enoxaparin (LOVENOX) injection  40 mg Subcutaneous Q24H   [START ON 09/22/2021] furosemide  40 mg Oral Daily   influenza vac split quadrivalent PF  0.5  mL Intramuscular Tomorrow-1000   insulin aspart  0-15 Units Subcutaneous TID WC   insulin aspart  0-5 Units Subcutaneous QHS   insulin detemir  7 Units Subcutaneous QHS   ipratropium-albuterol  3 mL Nebulization QID   methylPREDNISolone (SOLU-MEDROL) injection  40 mg Intravenous Q12H   nicotine   21 mg Transdermal Daily   pantoprazole  40 mg Oral Daily   pneumococcal 23 valent vaccine  0.5 mL Intramuscular Tomorrow-1000   Continuous Infusions:  azithromycin 500 mg (09/21/21 1020)   cefTRIAXone (ROCEPHIN)  IV 1 g (09/21/21 0920)     LOS: 1 day    Roxan Hockey M.D on 09/21/2021 at 3:33 PM  Go to [email or web address] - for contact info  [facility] - Office  [phone]  If 7PM-7AM, please contact night-coverage [email or web address] Password [facility] 09/21/2021, 3:33 PM

## 2021-09-22 ENCOUNTER — Inpatient Hospital Stay (HOSPITAL_COMMUNITY): Payer: Self-pay

## 2021-09-22 DIAGNOSIS — Z720 Tobacco use: Secondary | ICD-10-CM | POA: Diagnosis present

## 2021-09-22 DIAGNOSIS — I5189 Other ill-defined heart diseases: Secondary | ICD-10-CM

## 2021-09-22 DIAGNOSIS — I5020 Unspecified systolic (congestive) heart failure: Secondary | ICD-10-CM

## 2021-09-22 DIAGNOSIS — I5043 Acute on chronic combined systolic (congestive) and diastolic (congestive) heart failure: Secondary | ICD-10-CM

## 2021-09-22 DIAGNOSIS — J441 Chronic obstructive pulmonary disease with (acute) exacerbation: Secondary | ICD-10-CM | POA: Diagnosis present

## 2021-09-22 LAB — GLUCOSE, CAPILLARY
Glucose-Capillary: 114 mg/dL — ABNORMAL HIGH (ref 70–99)
Glucose-Capillary: 200 mg/dL — ABNORMAL HIGH (ref 70–99)

## 2021-09-22 LAB — BASIC METABOLIC PANEL
Anion gap: 9 (ref 5–15)
BUN: 26 mg/dL — ABNORMAL HIGH (ref 8–23)
CO2: 29 mmol/L (ref 22–32)
Calcium: 9.5 mg/dL (ref 8.9–10.3)
Chloride: 102 mmol/L (ref 98–111)
Creatinine, Ser: 1.53 mg/dL — ABNORMAL HIGH (ref 0.61–1.24)
GFR, Estimated: 50 mL/min — ABNORMAL LOW (ref >60)
Glucose, Bld: 121 mg/dL — ABNORMAL HIGH (ref 70–99)
Potassium: 4.3 mmol/L (ref 3.5–5.1)
Sodium: 140 mmol/L (ref 135–145)

## 2021-09-22 MED ORDER — GUAIFENESIN ER 600 MG PO TB12
600.0000 mg | ORAL_TABLET | Freq: Two times a day (BID) | ORAL | 0 refills | Status: AC
Start: 2021-09-22 — End: 2021-10-02

## 2021-09-22 MED ORDER — FUROSEMIDE 20 MG PO TABS: 20.0000 mg | ORAL_TABLET | ORAL | 3 refills | Status: AC

## 2021-09-22 MED ORDER — NICOTINE 21 MG/24HR TD PT24: 21.0000 mg | MEDICATED_PATCH | Freq: Every day | TRANSDERMAL | 0 refills | Status: AC

## 2021-09-22 MED ORDER — SPIRONOLACTONE 25 MG PO TABS: 25.0000 mg | ORAL_TABLET | Freq: Every day | ORAL | 11 refills | Status: AC

## 2021-09-22 MED ORDER — AZITHROMYCIN 500 MG PO TABS: 500.0000 mg | ORAL_TABLET | Freq: Every day | ORAL | 0 refills | Status: AC

## 2021-09-22 MED ORDER — ALBUTEROL SULFATE (2.5 MG/3ML) 0.083% IN NEBU: 2.5000 mg | INHALATION_SOLUTION | RESPIRATORY_TRACT | 12 refills | Status: AC | PRN

## 2021-09-22 MED ORDER — VALSARTAN 40 MG PO TABS: 40.0000 mg | ORAL_TABLET | Freq: Every day | ORAL | 11 refills | Status: AC

## 2021-09-22 MED ORDER — BISOPROLOL-HYDROCHLOROTHIAZIDE 2.5-6.25 MG PO TABS: 1.0000 | ORAL_TABLET | Freq: Every day | ORAL | 11 refills | Status: AC

## 2021-09-22 MED ORDER — ALBUTEROL SULFATE HFA 108 (90 BASE) MCG/ACT IN AERS: 2.0000 | INHALATION_SPRAY | Freq: Four times a day (QID) | RESPIRATORY_TRACT | 2 refills | Status: AC | PRN

## 2021-09-22 MED ORDER — SPIRIVA HANDIHALER 18 MCG IN CAPS: 18.0000 ug | ORAL_CAPSULE | Freq: Every day | RESPIRATORY_TRACT | 2 refills | Status: AC

## 2021-09-22 MED ORDER — PREDNISONE 20 MG PO TABS: 40.0000 mg | ORAL_TABLET | Freq: Every day | ORAL | 0 refills | Status: AC

## 2021-09-22 MED ORDER — CEFDINIR 300 MG PO CAPS: 300.0000 mg | ORAL_CAPSULE | Freq: Two times a day (BID) | ORAL | 0 refills | Status: AC

## 2021-09-22 NOTE — Progress Notes (Signed)
SATURATION QUALIFICATIONS: (This note is used to comply with regulatory documentation for home oxygen)  Patient Saturations on Room Air at Rest = 92%  Patient Saturations on Room Air while Ambulating = 91%   Pt ambulated > 500 feet on room air, tolerated well. No increased SOB, able to carry on normal conversation while walking.  MD Courage notified of results.

## 2021-09-22 NOTE — Clinical Social Work Note (Addendum)
Consulted for heart failure home screen, patient does not have diagnosis of heart failure. Patient did indicated that he does not follow a HH diet nor does he take daily weights.  Patient in need of nebulizer. Discussed DME choices. Patient wants nebulizer to be delivered to his home.  Nebulizer ordered from [place] at [employer].   Aaron Donaldson, [name], LCSW

## 2021-09-22 NOTE — Discharge Summary (Signed)
Aaron Donaldson, is a 65 y.o. male  DOB 27-Nov-1955  MRN [id number].  Admission date:  09/20/2021  Admitting Physician  Barton Dubois, MD  Discharge Date:  09/22/2021   Primary MD  Patient, No Pcp Per (Inactive)  Recommendations for primary care physician for things to follow:   1)Very low-salt diet advised 2)Weigh yourself daily, call if you gain more than 3 pounds in 1 day or more than 5 pounds in 1 week as your diuretic medications (Lasix/Furosemide) may need to be adjusted 3)Limit your Fluid  intake to no more than 60 ounces (1.8 Liters) per day 4)Complete abstinence from Tobacco advised  5)Follow up with Cardiologist as outpatient for Heart Failure evaluation---- 6)Avoid ibuprofen/Advil/Aleve/Motrin/Goody Powders/Naproxen/BC powders/Meloxicam/Diclofenac/Indomethacin and other Nonsteroidal anti-inflammatory medications as these will make you more likely to bleed and can cause stomach ulcers, can also cause Kidney problems.  7)Repeat BMP/Kidney and Electrolyte Test in 7 to 10 days    Admission Diagnosis  COPD exacerbation (HCC) [J44.1] Acute respiratory failure with hypoxia (HCC) [J96.01] Acute respiratory failure, unspecified whether with hypoxia or hypercapnia ([place]) [J96.00]   Discharge Diagnosis  COPD exacerbation ([place]) [J44.1] Acute respiratory failure with hypoxia (HCC) [J96.01] Acute respiratory failure, unspecified whether with hypoxia or hypercapnia ([place]) [J96.00]    Principal Problem:   Sepsis due to Lt Sided CAP Active Problems:   Acute respiratory failure with hypoxia (HCC)   Acute on chronic combined systolic and diastolic CHF (congestive heart failure) /EF 20 to 25 % with Grade 2 Diastolic Dysfucntion   HFrEF (heart failure with reduced ejection fraction) - EF 20 to 25%   Grade II diastolic dysfunction/Combined Systolic and Diastolic CHF    CKD (chronic kidney disease), stage IIIA    Diabetes mellitus type 2, controlled ([place])   COPD with acute exacerbation /Tobacco Abuse   Tobacco abuse      Past Medical History:  Diagnosis Date   Asthma    Hypertension     Past Surgical History:  Procedure Laterality Date   CARDIAC SURGERY       HPI  from the history and physical done on the day of admission:     Chief Complaint: Shortness of breath, dyspnea on exertion and coughing spells.   HPI: Aaron Donaldson is a 65 y.o. male with medical history significant of hypertension, chronic kidney disease a stage IIIa, asthma/COPD and tobacco abuse; who presented to the emergency department secondary to worsening shortness of breath, dyspnea exertion productive cough.  Patient reports symptoms have been present for the last 2 days or so and worsening.  Yesterday night he has to call EMS secondary to worsening respiratory status he was assessed nebulized improved and initially not transported for further evaluation.  Couple hours later symptoms worsen more and EMS was recalled; at that time his steroids IV were provided and due to significant hypoxia appreciated patient was started on CPAP and transported to the emergency department for further evaluation and management.  Patient reports associated productive coughing spells and general malaise. Patient reports no  nausea, no vomiting, no hemoptysis, no dysuria, no hematuria, no focal weaknesses, no sick contact, overt bleeding or any other complaints.   Not vaccinated against COVID.  COVID PCR in the ED negative.   ED Course: Positive tachypnea and high temperature; chest x-ray demonstrating left lower lobe infiltrate suggestive of pneumonia and also vascular congestion.  Mild elevation in his troponin without acute ischemic changes on EKG.  Normal WBCs, normal BPN, elevated blood sugar and a creatinine of 1.75.  Patient placed on BiPAP, steroids and nebulizer management provided.  Empiric antibiotics for pneumonia initiated and TRH call  to place in the hospital for further evaluation and management.   Review of Systems: As per HPI otherwise all other systems reviewed and are negative.       Hospital Course:       Brief Narrative:  65 y.o. male with medical history significant of hypertension, chronic kidney disease  stage IIIa, asthma/COPD and tobacco abuse admitted with acute hypoxic respiratory failure on 09/20/2021 in the setting of pneumonia   Assessment & Plan:   Principal Problem: Patient Active Problem List   Diagnosis Date Noted   Acute on chronic combined systolic and diastolic CHF (congestive heart failure) /EF 20 to 25 % with Grade 2 Diastolic Dysfucntion 123456    Priority: High   HFrEF (heart failure with reduced ejection fraction) - EF 20 to 25% 09/22/2021    Priority: High   Grade II diastolic dysfunction/Combined Systolic and Diastolic CHF  123456    Priority: High   Sepsis due to Lt Sided CAP 09/21/2021    Priority: High   Acute respiratory failure with hypoxia ([place]) 09/20/2021    Priority: High   COPD with acute exacerbation /Tobacco Abuse 09/22/2021    Priority: Medium    Tobacco abuse 09/22/2021    Priority: Medium    CKD (chronic kidney disease), stage IIIA 09/21/2021    Priority: Medium    Diabetes mellitus type 2, controlled ([place]) 09/21/2021    Priority: Medium       1)Sepsis secondary to left-sided community-acquired pneumonia-- -COVID-negative -Sepsis pathophysiology resolved -Treated with IV Rocephin and azithromycin,bronchodilators and mucolytics -Okay to discharge on p.o. azithromycin and Omnicef, along with bronchodilators and mucolytics   2)Acute COPD exacerbation/tobacco abuse --- secondary to #1 above -Management as above 1,  -Treated with IV Solu-Medrol as ordered, nicotine patch as ordered -Discharge home on p.o. prednisone, Spiriva and albuterol nebulizer treatments.   3) acute hypoxic respiratory failure----to #1 #2 above,  -Initially O2 sats was 86% on  room air patient required 2 L of oxygen via nasal cannula to keep O2 sats above 90%, with treatment as above #1 at 2 hypoxia resolved  -Patient was not on O2 PTA -Hypoxia has resolved, O2 sats in low 90s on room air post ambulation   4)AKI----acute kidney injury on CKD stage -3A   -Creatinine is down to 1.5 from 1.75  renally adjust medications, avoid nephrotoxic agents / dehydration  / hypotension -Repeat BMP in about a week or so due to need for losartan, Aldactone and Lasix   5)HFrEF--patient will combined systolic and diastolic dysfunction CHF -Echo  from 09/21/2021 shows EF of 20 to 123456, grade 2 diastolic dysfunction and regional wall motion abnormalities -Start bisoprolol/HCTZ, losartan and Aldactone -May be a candidate for Entresto--will defer decision to cardiology team -May benefit from [facility] to rule out ischemic cardiomyopathy--we will defer decision to cardiology -May use Lasix as needed with weight gain/fluid overload -Outpatient follow-up with cardiology strongly  advised  6)DM2-A1c 6.4 reflecting fair diabetic control PTA --Diet controlled advised anticipate that sugars will improve once patient is off prednisone    Disposition--- Home   Disposition: The patient is from: Home              Anticipated d/c is to: Home             Code Status :  -  Code Status: Full Code    Family Communication:    NA (patient is alert, awake and coherent)    Consults  :  Na  Discharge Condition: stable  Follow UP----cardiology service  Follow-up Information     Erma Heritage, PA-C. Schedule an appointment as soon as possible for a visit in 1 week(s).   Specialties: Physician Assistant, Cardiology Contact information: [place] [phone]                 Diet and Activity recommendation:  As advised  Discharge Instructions    Discharge Instructions     ([place]) Call MD:  Anytime you have any of the following symptoms: 1)  3 pound weight gain in 24 hours or 5 pounds in 1 week 2) shortness of breath, with or without a dry hacking cough 3) swelling in the hands, feet or stomach 4) if you have to sleep on extra pillows at night in order to breathe.   Complete by: As directed    AMB referral to CHF clinic   Complete by: As directed    Call MD for:  difficulty breathing, headache or visual disturbances   Complete by: As directed    Call MD for:  persistant dizziness or light-headedness   Complete by: As directed    Call MD for:  persistant nausea and vomiting   Complete by: As directed    Call MD for:  severe uncontrolled pain   Complete by: As directed    Call MD for:  temperature >100.4   Complete by: As directed    Diet - low sodium heart healthy   Complete by: As directed    Discharge instructions   Complete by: As directed    1)Very low-salt diet advised 2)Weigh yourself daily, call if you gain more than 3 pounds in 1 day or more than 5 pounds in 1 week as your diuretic medications (Lasix/Furosemide) may need to be adjusted 3)Limit your Fluid  intake to no more than 60 ounces (1.8 Liters) per day 4)Complete abstinence from Tobacco advised  5)Follow up with Cardiologist as outpatient for Heart Failure evaluation---- 6)Avoid ibuprofen/Advil/Aleve/Motrin/Goody Powders/Naproxen/BC powders/Meloxicam/Diclofenac/Indomethacin and other Nonsteroidal anti-inflammatory medications as these will make you more likely to bleed and can cause stomach ulcers, can also cause Kidney problems.  7)Repeat BMP/Kidney and Electrolyte Test in 7 to 10 days   For home use only DME Nebulizer machine   Complete by: As directed    Patient needs a nebulizer to treat with the following condition: COPD with acute exacerbation ([place])   Length of Need: Lifetime   Increase activity slowly   Complete by: As directed          Discharge Medications     Allergies as of 09/22/2021   No Known Allergies      Medication List     STOP  taking these medications    cyclobenzaprine 10 MG tablet Commonly known as: FLEXERIL       TAKE these medications    albuterol (2.5 MG/3ML) 0.083%  nebulizer solution Commonly known as: PROVENTIL Take 3 mLs (2.5 mg total) by nebulization every 2 (two) hours as needed for wheezing or shortness of breath.   albuterol 108 (90 Base) MCG/ACT inhaler Commonly known as: VENTOLIN HFA Inhale 2 puffs into the lungs every 6 (six) hours as needed for wheezing or shortness of breath.   azithromycin 500 MG tablet Commonly known as: ZITHROMAX Take 1 tablet (500 mg total) by mouth daily for 5 days.   bisoprolol-hydrochlorothiazide 2.5-6.25 MG tablet Commonly known as: ZIAC Take 1 tablet by mouth daily. For Heart   cefdinir 300 MG capsule Commonly known as: OMNICEF Take 1 capsule (300 mg total) by mouth 2 (two) times daily for 5 days.   furosemide 20 MG tablet Commonly known as: Lasix Take 1 tablet (20 mg total) by mouth See admin instructions. Take 1 tablet As needed if you gain more than 3lb in 1 day, or 5lb in 1 week   guaiFENesin 600 MG 12 hr tablet Commonly known as: Mucinex Take 1 tablet (600 mg total) by mouth 2 (two) times daily for 10 days.   nicotine 21 mg/24hr patch Commonly known as: NICODERM CQ - dosed in mg/24 hours Place 1 patch (21 mg total) onto the skin daily. Start taking on: September 23, 2021   predniSONE 20 MG tablet Commonly known as: DELTASONE Take 2 tablets (40 mg total) by mouth daily with breakfast for 5 days.   Spiriva HandiHaler 18 MCG inhalation capsule Generic drug: tiotropium Place 1 capsule (18 mcg total) into inhaler and inhale daily.   spironolactone 25 MG tablet Commonly known as: Aldactone Take 1 tablet (25 mg total) by mouth daily.   valsartan 40 MG tablet Commonly known as: Diovan Take 1 tablet (40 mg total) by mouth daily.               Durable Medical Equipment  (From admission, onward)           Start     Ordered    09/22/21 0000  For home use only DME Nebulizer machine       Question Answer Comment  Patient needs a nebulizer to treat with the following condition COPD with acute exacerbation ([place])   Length of Need Lifetime      09/22/21 1109            Major procedures and Radiology Reports - PLEASE review detailed and final reports for all details, in brief -   DG Chest Port 1 View  Result Date: 09/20/2021 CLINICAL DATA:  Shortness of breath EXAM: PORTABLE CHEST 1 VIEW COMPARISON:  None. FINDINGS: Heart is enlarged. Mediastinum appears stable. Calcified plaques in the aortic arch. Pulmonary vascular congestion and prominent interstitial lung markings. Likely small left pleural effusion with associated atelectasis/infiltrate. No pneumothorax. IMPRESSION: 1. Cardiomegaly and pulmonary vascular congestion. 2. Likely small left pleural effusion with associated atelectasis/scarring/infiltrate. 3. COPD. Electronically Signed   By: Ofilia Neas M.D.   On: 09/20/2021 07:35   ECHOCARDIOGRAM COMPLETE  Result Date: 09/21/2021    ECHOCARDIOGRAM REPORT   Patient Name:   Aaron Donaldson Date of Exam: 09/21/2021 Medical Rec #:  [id number]         Height:       74.0 in Accession #:    [id number]        Weight:       180.1 lb Date of Birth:  1956-11-15         BSA:  2.079 m Patient Age:    72 years          BP:           142/84 mmHg Patient Gender: M                 HR:           73 bpm. Exam Location:  [facility] Procedure: 2D Echo, Cardiac Doppler and Color Doppler Indications:    Dyspnea  History:        Patient has no prior history of Echocardiogram examinations.                 Respiratory failure.  Sonographer:    Wenda Low Referring Phys: [place]  1. Left ventricular ejection fraction, by estimation, is 20 to 25%. The left ventricle has severely decreased function. The left ventricle demonstrates global hypokinesis. The left ventricular internal cavity size was severely  dilated. Left ventricular diastolic parameters are consistent with Grade II diastolic dysfunction (pseudonormalization).  2. Right ventricular systolic function is moderately reduced. The right ventricular size is mildly enlarged. There is normal pulmonary artery systolic pressure. The estimated right ventricular systolic pressure is 0000000 mmHg.  3. The mitral valve is grossly normal. Mild to moderate mitral valve regurgitation. No evidence of mitral stenosis.  4. The aortic valve is tricuspid. There is mild calcification of the aortic valve. There is mild thickening of the aortic valve. Aortic valve regurgitation is trivial. Mild aortic valve sclerosis is present, with no evidence of aortic valve stenosis.  5. Aortic dilatation noted. There is mild dilatation of the aortic root, measuring 41 mm. There is mild dilatation of the ascending aorta, measuring 40 mm.  6. The inferior vena cava is normal in size with greater than 50% respiratory variability, suggesting right atrial pressure of 3 mmHg. FINDINGS  Left Ventricle: Left ventricular ejection fraction, by estimation, is 20 to 25%. The left ventricle has severely decreased function. The left ventricle demonstrates global hypokinesis. The left ventricular internal cavity size was severely dilated. There is no left ventricular hypertrophy. Left ventricular diastolic parameters are consistent with Grade II diastolic dysfunction (pseudonormalization). Right Ventricle: The right ventricular size is mildly enlarged. No increase in right ventricular wall thickness. Right ventricular systolic function is moderately reduced. There is normal pulmonary artery systolic pressure. The tricuspid regurgitant velocity is 2.73 m/s, and with an assumed right atrial pressure of 3 mmHg, the estimated right ventricular systolic pressure is 0000000 mmHg. Left Atrium: Left atrial size was normal in size. Right Atrium: Right atrial size was normal in size. Pericardium: Trivial pericardial  effusion is present. Mitral Valve: The mitral valve is grossly normal. Mild to moderate mitral valve regurgitation. No evidence of mitral valve stenosis. MV peak gradient, 3.2 mmHg. The mean mitral valve gradient is 1.0 mmHg. Tricuspid Valve: The tricuspid valve is grossly normal. Tricuspid valve regurgitation is trivial. No evidence of tricuspid stenosis. Aortic Valve: The aortic valve is tricuspid. There is mild calcification of the aortic valve. There is mild thickening of the aortic valve. Aortic valve regurgitation is trivial. Mild aortic valve sclerosis is present, with no evidence of aortic valve stenosis. Aortic valve mean gradient measures 3.0 mmHg. Aortic valve peak gradient measures 6.7 mmHg. Aortic valve area, by VTI measures 2.92 cm. Pulmonic Valve: The pulmonic valve was grossly normal. Pulmonic valve regurgitation is not visualized. No evidence of pulmonic stenosis. Aorta: Aortic dilatation noted. There is mild dilatation of the aortic root, measuring 41  mm. There is mild dilatation of the ascending aorta, measuring 40 mm. Venous: The inferior vena cava is normal in size with greater than 50% respiratory variability, suggesting right atrial pressure of 3 mmHg. IAS/Shunts: The atrial septum is grossly normal.  LEFT VENTRICLE PLAX 2D LVIDd:         7.00 cm      Diastology LVIDs:         5.70 cm      LV e' medial:    6.42 cm/s LV PW:         1.10 cm      LV E/e' medial:  9.9 LV IVS:        1.20 cm      LV e' lateral:   6.74 cm/s LVOT diam:     2.10 cm      LV E/e' lateral: 9.5 LV SV:         65 LV SV Index:   31 LVOT Area:     3.46 cm  LV Volumes (MOD) LV vol d, MOD A2C: 210.0 ml LV vol d, MOD A4C: 174.0 ml LV vol s, MOD A2C: 140.0 ml LV vol s, MOD A4C: 130.0 ml LV SV MOD A2C:     70.0 ml LV SV MOD A4C:     174.0 ml LV SV MOD BP:      54.6 ml RIGHT VENTRICLE RV Basal diam:  3.75 cm RV Mid diam:    3.40 cm RV S prime:     10.10 cm/s TAPSE (M-mode): 2.3 cm LEFT ATRIUM             Index        RIGHT  ATRIUM           Index LA diam:        4.00 cm 1.92 cm/m   RA Area:     18.50 cm LA Vol (A2C):   69.8 ml 33.58 ml/m  RA Volume:   54.10 ml  26.02 ml/m LA Vol (A4C):   71.2 ml 34.25 ml/m LA Biplane Vol: 75.3 ml 36.22 ml/m  AORTIC VALVE                    PULMONIC VALVE AV Area (Vmax):    2.47 cm     PV Vmax:       0.77 m/s AV Area (Vmean):   2.60 cm     PV Peak grad:  2.4 mmHg AV Area (VTI):     2.92 cm AV Vmax:           129.00 cm/s AV Vmean:          81.000 cm/s AV VTI:            0.222 m AV Peak Grad:      6.7 mmHg AV Mean Grad:      3.0 mmHg LVOT Vmax:         91.90 cm/s LVOT Vmean:        60.800 cm/s LVOT VTI:          0.187 m LVOT/AV VTI ratio: 0.84  AORTA Ao Root diam: 3.90 cm Ao Asc diam:  4.00 cm MITRAL VALVE               TRICUSPID VALVE MV Area (PHT): 4.93 cm    TR Peak grad:   29.8 mmHg MV Area VTI:   2.89 cm    TR Vmax:        273.00  cm/s MV Peak grad:  3.2 mmHg MV Mean grad:  1.0 mmHg    SHUNTS MV Vmax:       0.89 m/s    Systemic VTI:  0.19 m MV Vmean:      47.7 cm/s   Systemic Diam: 2.10 cm MV Decel Time: 154 msec MV E velocity: 63.70 cm/s MV A velocity: 52.00 cm/s MV E/A ratio:  1.23 Eleonore Chiquito MD Electronically signed by Eleonore Chiquito MD Signature Date/Time: 09/21/2021/4:41:16 PM    Final     Micro Results   Recent Results (from the past 240 hour(s))  Culture, blood (Routine X 2) w Reflex to ID Panel     Status: None (Preliminary result)   Collection Time: 09/20/21  8:00 AM   Specimen: BLOOD LEFT HAND  Result Value Ref Range Status   Specimen Description BLOOD LEFT HAND  Final   Special Requests   Final    BOTTLES DRAWN AEROBIC AND ANAEROBIC Blood Culture results may not be optimal due to an inadequate volume of blood received in culture bottles   Culture   Final    NO GROWTH 2 DAYS Performed at [facility], [address], [place], [place] [address]    Report Status PENDING  Incomplete  Culture, blood (Routine X 2) w Reflex to ID Panel     Status: None (Preliminary  result)   Collection Time: 09/20/21  8:08 AM   Specimen: Right Antecubital; Blood  Result Value Ref Range Status   Specimen Description RIGHT ANTECUBITAL  Final   Special Requests   Final    BOTTLES DRAWN AEROBIC AND ANAEROBIC Blood Culture results may not be optimal due to an inadequate volume of blood received in culture bottles   Culture   Final    NO GROWTH 2 DAYS Performed at [facility], [address] [place],  [address]    Report Status PENDING  Incomplete  Resp Panel by RT-PCR (Flu A&B, Covid) Nasopharyngeal Swab     Status: None   Collection Time: 09/20/21  9:59 AM   Specimen: Nasopharyngeal Swab; Nasopharyngeal(NP) swabs in vial transport medium  Result Value Ref Range Status   SARS Coronavirus 2 by RT PCR NEGATIVE NEGATIVE Final    Comment: (NOTE) SARS-CoV-2 target nucleic acids are NOT DETECTED.  The SARS-CoV-2 RNA is generally detectable in upper respiratory specimens during the acute phase of infection. The lowest concentration of SARS-CoV-2 viral copies this assay can detect is 138 copies/mL. A negative result does not preclude SARS-Cov-2 infection and should not be used as the sole basis for treatment or other patient management decisions. A negative result may occur with  improper specimen collection/handling, submission of specimen other than nasopharyngeal swab, presence of viral mutation(s) within the areas targeted by this assay, and inadequate number of viral copies(<138 copies/mL). A negative result must be combined with clinical observations, patient history, and epidemiological information. The expected result is Negative.  Fact Sheet for Patients:  EntrepreneurPulse.com.au  Fact Sheet for Healthcare Providers:  IncredibleEmployment.be  This test is no t yet approved or cleared by the [place] FDA and  has been authorized for detection and/or diagnosis of SARS-CoV-2 by FDA under an Emergency Use  Authorization (EUA). This EUA will remain  in effect (meaning this test can be used) for the duration of the COVID-19 declaration under Section 564(b)(1) of the Act, 21 U.S.C.section 360bbb-3(b)(1), unless the authorization is terminated  or revoked sooner.       Influenza A by PCR NEGATIVE  NEGATIVE Final   Influenza B by PCR NEGATIVE NEGATIVE Final    Comment: (NOTE) The Xpert Xpress SARS-CoV-2/FLU/RSV plus assay is intended as an aid in the diagnosis of influenza from Nasopharyngeal swab specimens and should not be used as a sole basis for treatment. Nasal washings and aspirates are unacceptable for Xpert Xpress SARS-CoV-2/FLU/RSV testing.  Fact Sheet for Patients: BloggerCourse.com  Fact Sheet for Healthcare Providers: SeriousBroker.it  This test is not yet approved or cleared by the [place] FDA and has been authorized for detection and/or diagnosis of SARS-CoV-2 by FDA under an Emergency Use Authorization (EUA). This EUA will remain in effect (meaning this test can be used) for the duration of the COVID-19 declaration under Section 564(b)(1) of the Act, 21 U.S.C. section 360bbb-3(b)(1), unless the authorization is terminated or revoked.  Performed at [facility], [address] [place], [place] [address]   MRSA Next Gen by PCR, Nasal     Status: None   Collection Time: 09/20/21  1:00 PM   Specimen: Nasal Mucosa; Nasal Swab  Result Value Ref Range Status   MRSA by PCR Next Gen NOT DETECTED NOT DETECTED Final    Comment: (NOTE) The GeneXpert MRSA Assay (FDA approved for NASAL specimens only), is one component of a comprehensive MRSA colonization surveillance program. It is not intended to diagnose MRSA infection nor to guide or monitor treatment for MRSA infections. Test performance is not FDA approved in patients less than 85 years old. Performed at [facility], [address] [place], [place] [address]     Today   Subjective    Aaron Donaldson today has no new complaints           Patient has been seen and examined prior to discharge   Objective   Blood pressure 133/71, pulse (!) 57, temperature 98.1 F (36.7 C), resp. rate 20, height 6\' 2"  (1.88 m), weight 80.3 kg, SpO2 99 %.   Intake/Output Summary (Last 24 hours) at 09/22/2021 1134 Last data filed at 09/22/2021 1117 Gross per 24 hour  Intake 1370 ml  Output 1000 ml  Net 370 ml   Exam Gen:- Awake Alert, no acute distress  HEENT:- [place].AT, No sclera icterus Neck-Supple Neck,No JVD,.  Lungs-  CTAB , good air movement bilaterally  CV- S1, S2 normal, regular Abd-  +ve B.Sounds, Abd Soft, No tenderness,    Extremity/Skin:- No  edema,   good pulses Psych-affect is appropriate, oriented x3 Neuro-no new focal deficits, no tremors    Data Review   CBC w Diff:  Lab Results  Component Value Date   WBC 9.5 09/21/2021   HGB 15.0 09/21/2021   HCT 46.2 09/21/2021   PLT 204 09/21/2021   LYMPHOPCT 19 09/20/2021   MONOPCT 13 09/20/2021   EOSPCT 7 09/20/2021   BASOPCT 1 09/20/2021    CMP:  Lab Results  Component Value Date   NA 140 09/22/2021   K 4.3 09/22/2021   CL 102 09/22/2021   CO2 29 09/22/2021   BUN 26 (H) 09/22/2021   CREATININE 1.53 (H) 09/22/2021   PROT 8.2 (H) 09/20/2021   ALBUMIN 4.7 09/20/2021   BILITOT 1.4 (H) 09/20/2021   ALKPHOS 51 09/20/2021   AST 37 09/20/2021   ALT 64 (H) 09/20/2021  .  Total Discharge time is about 33 minutes  13/11/2020 M.D on 09/22/2021 at 11:34 AM  Go to [email or web address] -  for contact info  [facility] - Office  [phone]

## 2021-09-22 NOTE — Discharge Instructions (Addendum)
1)Very low-salt diet advised 2)Weigh yourself daily, call if you gain more than 3 pounds in 1 day or more than 5 pounds in 1 week as your diuretic medications (Lasix/Furosemide) may need to be adjusted 3)Limit your Fluid  intake to no more than 60 ounces (1.8 Liters) per day 4)Complete abstinence from Tobacco advised  5)Follow up with Cardiologist as outpatient for Heart Failure evaluation---- 6)Avoid ibuprofen/Advil/Aleve/Motrin/Goody Powders/Naproxen/BC powders/Meloxicam/Diclofenac/Indomethacin and other Nonsteroidal anti-inflammatory medications as these will make you more likely to bleed and can cause stomach ulcers, can also cause Kidney problems.  7)Repeat BMP/Kidney and Electrolyte Test in 7 to 10 days

## 2021-09-26 LAB — CULTURE, BLOOD (ROUTINE X 2)
Culture: NO GROWTH
Culture: NO GROWTH
